# Patient Record
Sex: Female | Born: 1950 | Race: Black or African American | Hispanic: No | Marital: Single
Health system: Southern US, Community
[De-identification: ages and names within clinical notes are randomized; demographics above are authoritative.]

## PROBLEM LIST (undated history)

## (undated) DIAGNOSIS — J45909 Unspecified asthma, uncomplicated: Secondary | ICD-10-CM

---

## 2017-06-18 ENCOUNTER — Emergency Department
Admission: EM | Admit: 2017-06-18 | Discharge: 2017-06-18 | Disposition: A | Discharge status: Home / Self Care | Payer: Self-pay | Attending: Emergency Medicine | Admitting: Emergency Medicine

## 2017-06-18 ENCOUNTER — Emergency Department: Payer: Self-pay

## 2017-06-18 ENCOUNTER — Other Ambulatory Visit: Payer: Self-pay

## 2017-06-18 DIAGNOSIS — Z79899 Other long term (current) drug therapy: Secondary | ICD-10-CM | POA: Insufficient documentation

## 2017-06-18 DIAGNOSIS — J45909 Unspecified asthma, uncomplicated: Secondary | ICD-10-CM | POA: Insufficient documentation

## 2017-06-18 DIAGNOSIS — J069 Acute upper respiratory infection, unspecified: Secondary | ICD-10-CM | POA: Insufficient documentation

## 2017-06-18 DIAGNOSIS — Z7982 Long term (current) use of aspirin: Secondary | ICD-10-CM | POA: Insufficient documentation

## 2017-06-18 DIAGNOSIS — E039 Hypothyroidism, unspecified: Secondary | ICD-10-CM | POA: Insufficient documentation

## 2017-06-18 DIAGNOSIS — I10 Essential (primary) hypertension: Secondary | ICD-10-CM | POA: Insufficient documentation

## 2017-06-18 HISTORY — DX: Unspecified asthma, uncomplicated: J45.909

## 2017-06-18 MED ORDER — DIPHENHYDRAMINE HCL 50 MG/ML IJ SOLN
INTRAMUSCULAR | Status: AC
  Filled 2017-06-18: qty 1

## 2017-06-18 MED ORDER — CETIRIZINE HCL 10 MG PO CAPS: 10.0000 mg | ORAL_CAPSULE | Freq: Every day | ORAL | 3 refills | Status: DC

## 2017-06-18 MED ORDER — PSEUDOEPH-BROMPHEN-DM 30-2-10 MG/5ML PO SYRP: 5.0000 mL | ORAL_SOLUTION | Freq: Four times a day (QID) | ORAL | 0 refills | Status: DC | PRN

## 2017-06-18 NOTE — ED Triage Notes (Signed)
C/O cough.  Onset of symptoms last evening.

## 2017-06-18 NOTE — Discharge Instructions (Signed)
Please follow-up with the primary care provider for symptoms that are not improving over the next few days.  Return to the emergency department for symptoms that change or worsen if you are unable to schedule an appointment.

## 2017-06-18 NOTE — ED Notes (Signed)
See triage note  Son states she developed prod cough last pm  No fever

## 2017-06-18 NOTE — ED Provider Notes (Signed)
[facility] Emergency Department Provider Note  ____________________________________________  Time seen: Approximately 8:14 AM  I have reviewed the triage vital signs and the nursing notes.   HISTORY  Chief Complaint Cough   HPI Tiffany Keith is a 67 y.o. female who presents to the emergency department for treatment and evaluation of cough and sore throat. Symptoms started yesterday. No alleviating measures have been attempted for this complaint. No known fever.    Past Medical History:  Diagnosis Date  . Asthma   . Hypertension   . Hypothyroidism     There are no active problems to display for this patient.   Past Surgical History:  Procedure Laterality Date  . COLONOSCOPY WITH PROPOFOL N/A 11/26/2014   Procedure: COLONOSCOPY WITH PROPOFOL;  Surgeon: Elnita MaxwellMatthew Gordon Rein, MD;  [facility];  Service: Endoscopy;  Laterality: N/A;    Prior to Admission medications   Medication Sig Start Date End Date Taking? Authorizing Provider  albuterol (PROVENTIL HFA;VENTOLIN HFA) 108 (90 Base) MCG/ACT inhaler Inhale 2-4 puffs by mouth every 4 hours as needed for wheezing, cough, and/or shortness of breath 10/27/16   Loleta RoseForbach, Cory, MD  aspirin 81 MG tablet Take 81 mg by mouth daily.    [provider]  atorvastatin (LIPITOR) 20 MG tablet Take 20 mg by mouth daily.    [provider]  brompheniramine-pseudoephedrine-DM 30-2-10 MG/5ML syrup Take 5 mLs by mouth 4 (four) times daily as needed. 06/18/17   Nixon Kolton B, FNP  cephALEXin (KEFLEX) 500 MG capsule Take 1 capsule (500 mg total) by mouth 2 (two) times daily. 10/26/16   Jene EveryKinner, Robert, MD  Cetirizine HCl 10 MG CAPS Take 1 capsule (10 mg total) by mouth daily. 06/18/17   Deanthony Maull, Rulon Eisenmengerari B, FNP  levothyroxine (SYNTHROID, LEVOTHROID) 125 MCG tablet Take 125 mcg by mouth daily before breakfast.    [provider]    Allergies Patient has no known allergies.  No family  history on file.  Social History Social History   Tobacco Use  . Smoking status: Never Smoker  . Smokeless tobacco: Never Used  Substance Use Topics  . Alcohol use: No  . Drug use: No    Review of Systems Constitutional: Negative for fever/chills ENT: Positive for sore throat. Cardiovascular: Denies chest pain. Respiratory: Negative for shortness of breath.  Positive for cough. Gastrointestinal: Negative for nausea, no vomiting.  No diarrhea.  Musculoskeletal: Negative for body aches Skin: Negative for rash. Neurological: Negative for headaches ____________________________________________   PHYSICAL EXAM:  VITAL SIGNS: ED Triage Vitals  Enc Vitals Group     BP 06/18/17 0742 140/85     Pulse Rate 06/18/17 0742 72     Resp 06/18/17 0742 16     Temp 06/18/17 0742 98.8 F (37.1 C)     Temp Source 06/18/17 0742 Oral     SpO2 06/18/17 0742 100 %     Weight 06/18/17 0742 168 lb 6.9 oz (76.4 kg)     Height --      Head Circumference --      Peak Flow --      Pain Score 06/18/17 0736 0     Pain Loc --      Pain Edu? --      Excl. in GC? --     Constitutional: Alert and oriented.  Well appearing and in no acute distress. Eyes: Conjunctivae are normal. EOMI. Ears: Bilateral tympanic membranes are mildly injected but otherwise normal Nose: No sinus congestion  noted; no rhinnorhea. Mouth/Throat: Mucous membranes are moist.  Oropharynx mildly erythematous. Tonsils not visualized Neck: No stridor.  Lymphatic: No anterior cervical lymphadenopathy. Cardiovascular: Normal rate, regular rhythm. Good peripheral circulation. Respiratory: Normal respiratory effort.  No retractions.  Breath sounds clear to auscultation throughout.  Gastrointestinal: Soft and nontender.  Musculoskeletal: FROM x 4 extremities.  Neurologic:  Normal speech and language.  Skin:  Skin is warm, dry and intact. No rash noted. Psychiatric: Mood and affect are normal. Speech and behavior are  normal.  ____________________________________________   LABS (all labs ordered are listed, but only abnormal results are displayed)  Labs Reviewed - No data to display _________________________________________ ____________________________________________  RADIOLOGY  Chest x-ray negative for acute cardiopulmonary abnormality per radiology. ____________________________________________   PROCEDURES  Procedure(s) performed: None  Critical Care performed: No ____________________________________________   INITIAL IMPRESSION / ASSESSMENT AND PLAN / ED COURSE  67 y.o. female who presents to the emergency department with her legal guardian for treatment and evaluation of cough and sore throat.  Symptoms started less than 24 hours ago.  Chest x-ray is negative.  Patient complains that the cough is keeping her awake at night and she is unable to lie flat without coughing.  She will be given a prescription for cetirizine and Bromfed.  Liquid guardian was encouraged to have her follow-up with the primary care provider for symptoms that do not improve over the next few days.  He was encouraged to return with her to the emergency department for symptoms of change or worsen if she is unable to schedule appointment.  Medications - No data to display   ED Discharge Orders        Ordered    Cetirizine HCl 10 MG CAPS  Daily     06/18/17 0842    brompheniramine-pseudoephedrine-DM 30-2-10 MG/5ML syrup  4 times daily PRN     06/18/17 0842       Pertinent labs & imaging results that were available during my care of the patient were reviewed by me and considered in my medical decision making (see chart for details).    If controlled substance prescribed during this visit, 12 month history viewed on the [facility] prior to issuing an initial prescription for Schedule II or III opiod. ____________________________________________   FINAL CLINICAL IMPRESSION(S) / ED DIAGNOSES  Final diagnoses:   Acute upper respiratory infection    Note:  This document was prepared using Dragon voice recognition software and may include unintentional dictation errors.     Chinita Pester, FNP 06/18/17 1610    Jene Every, MD 06/18/17 [phone]

## 2017-10-09 ENCOUNTER — Other Ambulatory Visit: Payer: Self-pay | Admitting: Family Medicine

## 2017-10-09 DIAGNOSIS — Z1231 Encounter for screening mammogram for malignant neoplasm of breast: Secondary | ICD-10-CM

## 2017-10-12 ENCOUNTER — Other Ambulatory Visit: Payer: Self-pay | Admitting: Family Medicine

## 2017-10-12 DIAGNOSIS — Q740 Other congenital malformations of upper limb(s), including shoulder girdle: Secondary | ICD-10-CM

## 2017-10-22 ENCOUNTER — Other Ambulatory Visit: Payer: Self-pay | Admitting: Family Medicine

## 2017-10-22 DIAGNOSIS — R609 Edema, unspecified: Secondary | ICD-10-CM

## 2017-10-22 DIAGNOSIS — Q740 Other congenital malformations of upper limb(s), including shoulder girdle: Secondary | ICD-10-CM

## 2017-10-23 ENCOUNTER — Ambulatory Visit
Admission: RE | Admit: 2017-10-23 | Discharge: 2017-10-23 | Disposition: A | Discharge status: Home / Self Care | Payer: Medicare Other | Source: Ambulatory Visit | Attending: Family Medicine | Admitting: Family Medicine

## 2017-10-23 DIAGNOSIS — Q740 Other congenital malformations of upper limb(s), including shoulder girdle: Secondary | ICD-10-CM | POA: Diagnosis not present

## 2017-10-23 DIAGNOSIS — R609 Edema, unspecified: Secondary | ICD-10-CM

## 2019-02-01 IMAGING — US US EXTREM  UP VENOUS*L*
1 series · 13 of 24 positions shown · non-contrast
Comparison: None.

CLINICAL DATA: Left upper arm swelling for 3 days.



[Series 1: us extrem up venous*left* · 0.08mm/px · 13 of 40 slices shown]
[im 1/40]
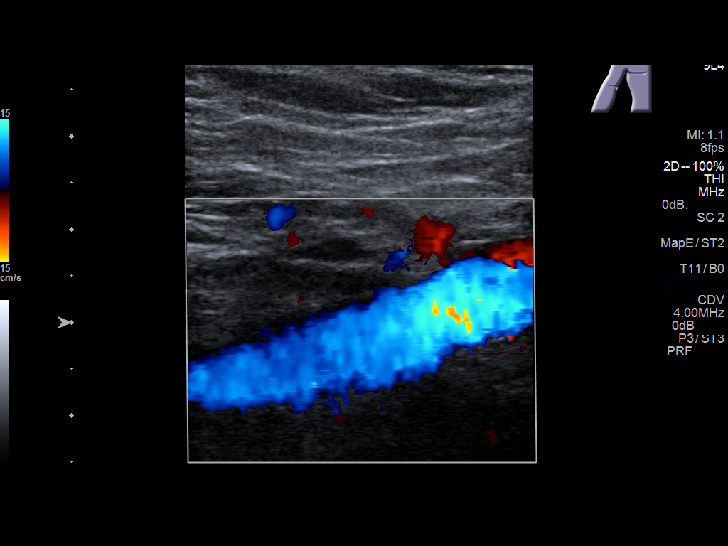
[im 4/40]
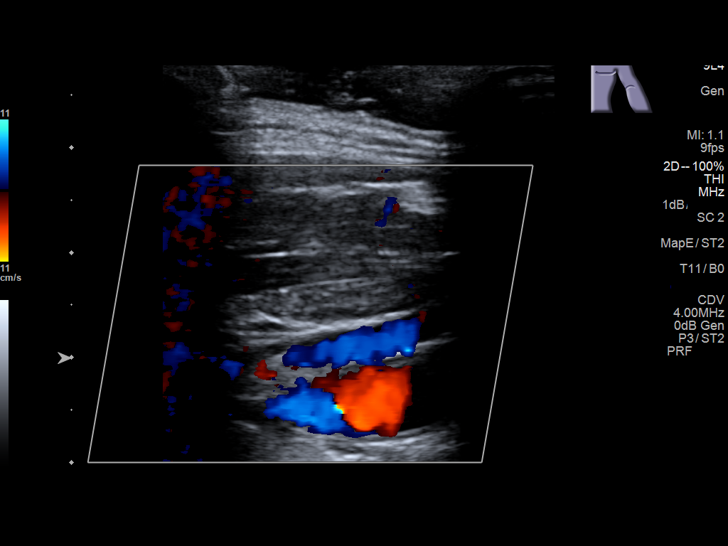
[im 7/40]
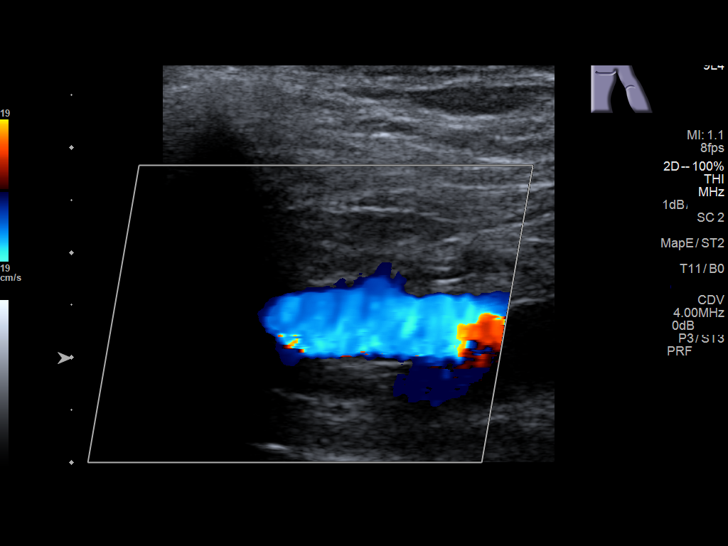
[im 11/40]
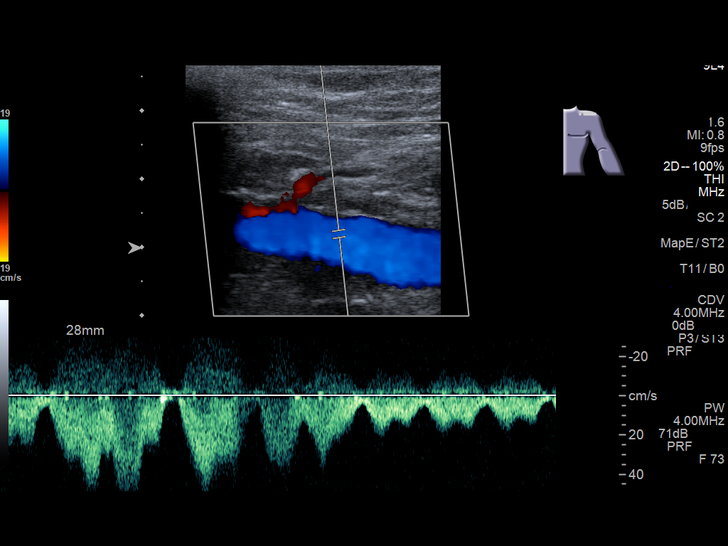
[im 14/40]
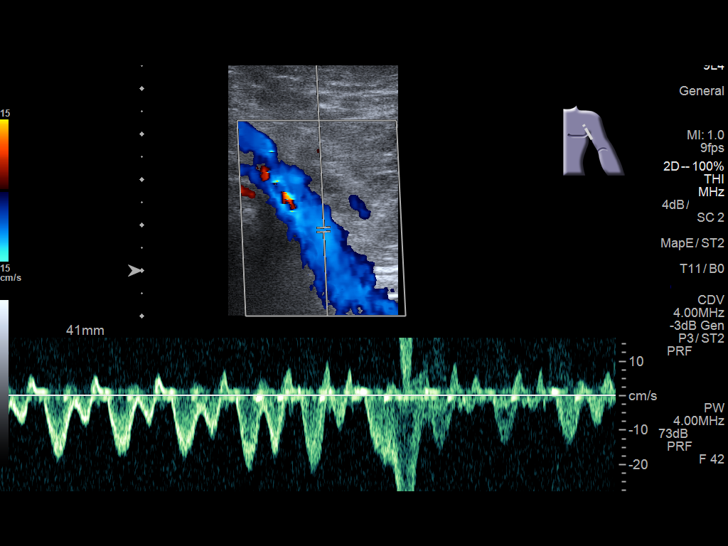
[im 17/40]
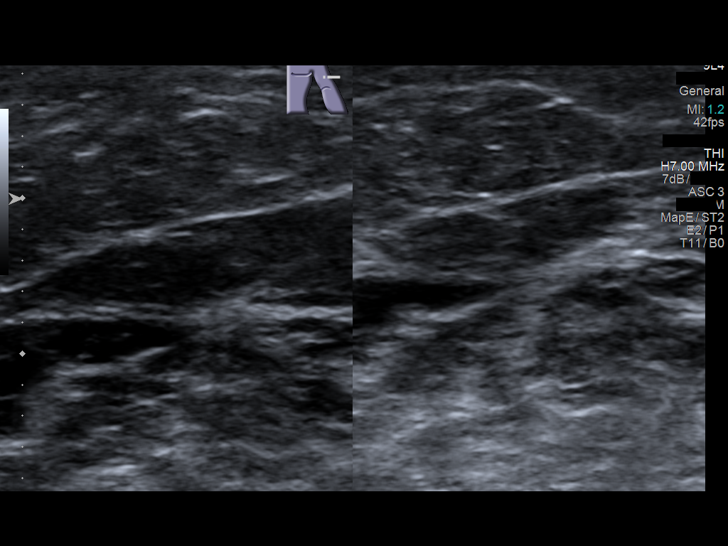
[im 21/40]
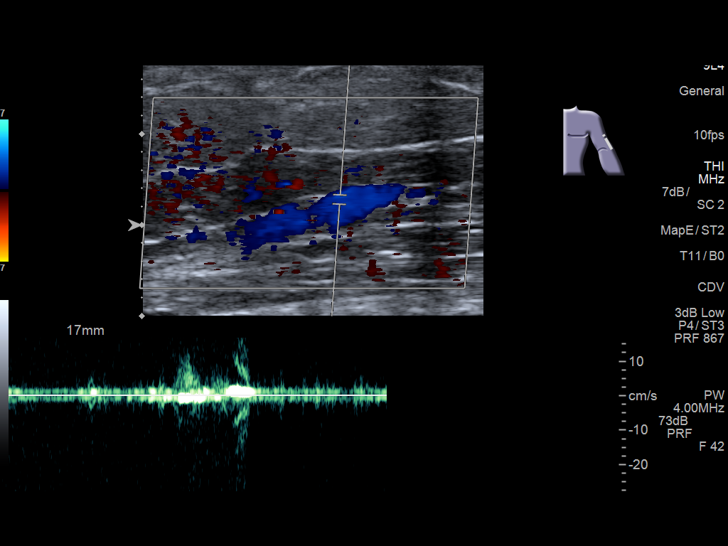
[im 23/40]
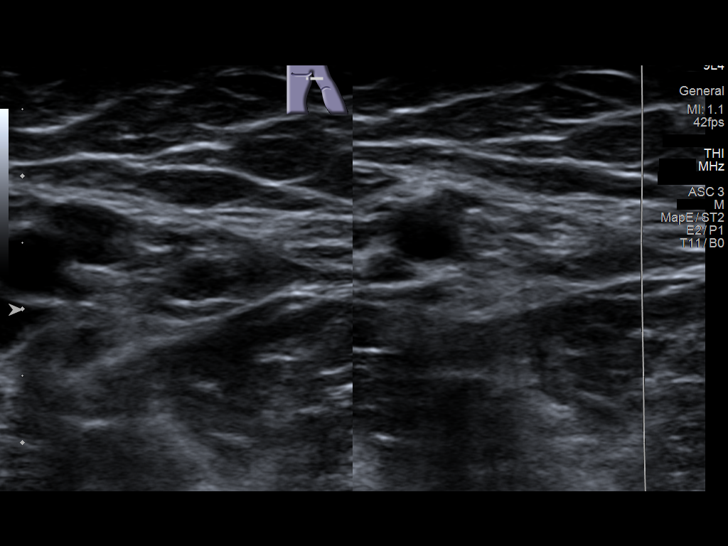
[im 26/40]
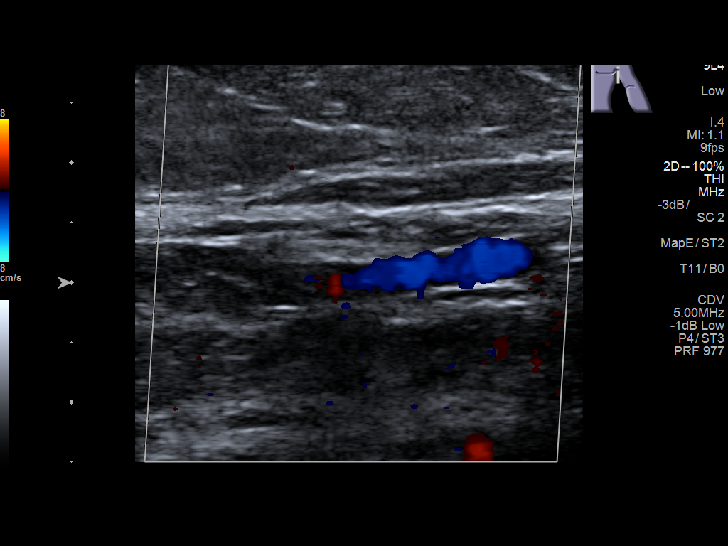
[im 29/40]
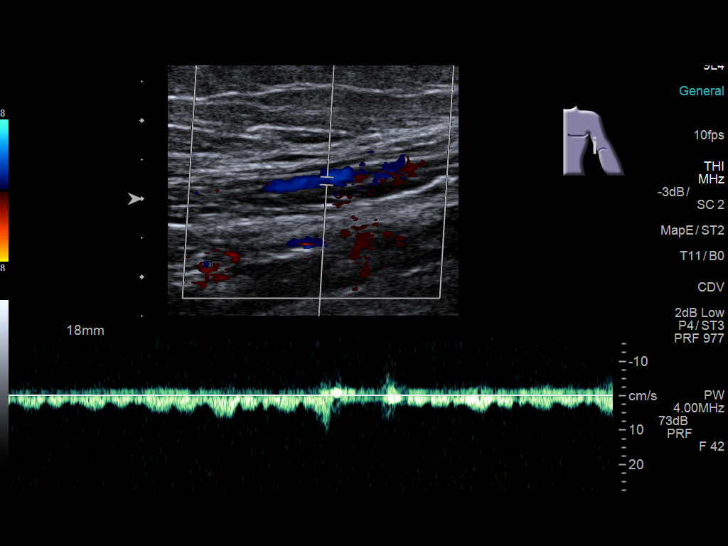
[im 33/40]
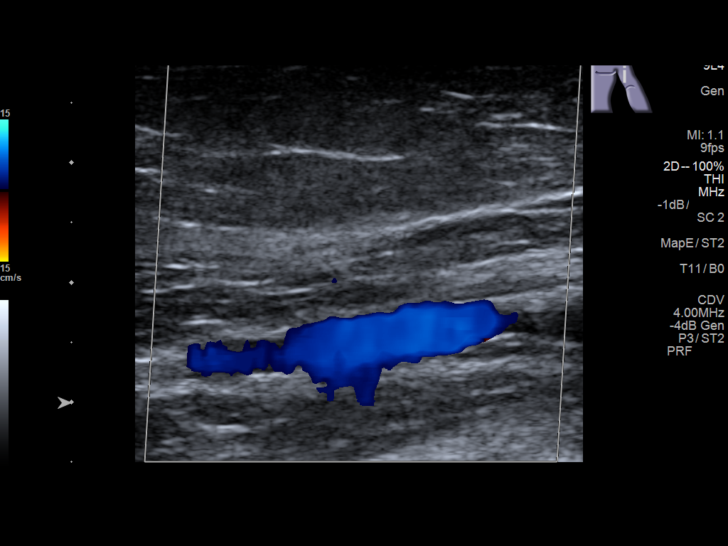
[im 36/40]
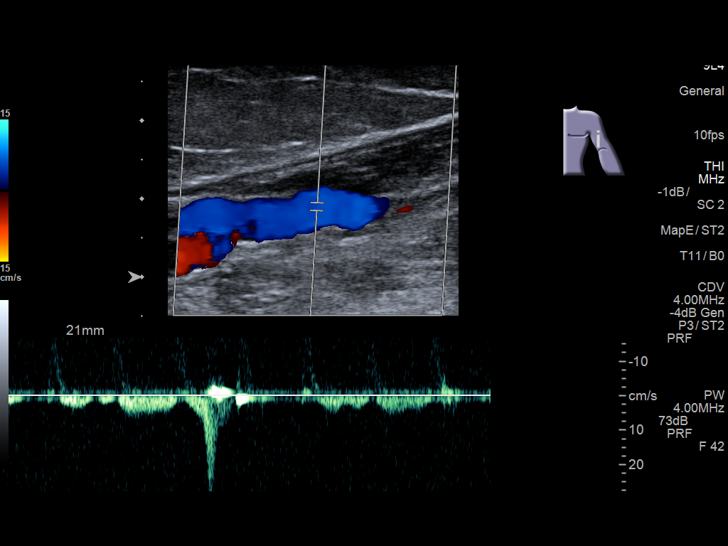
[im 40/40]
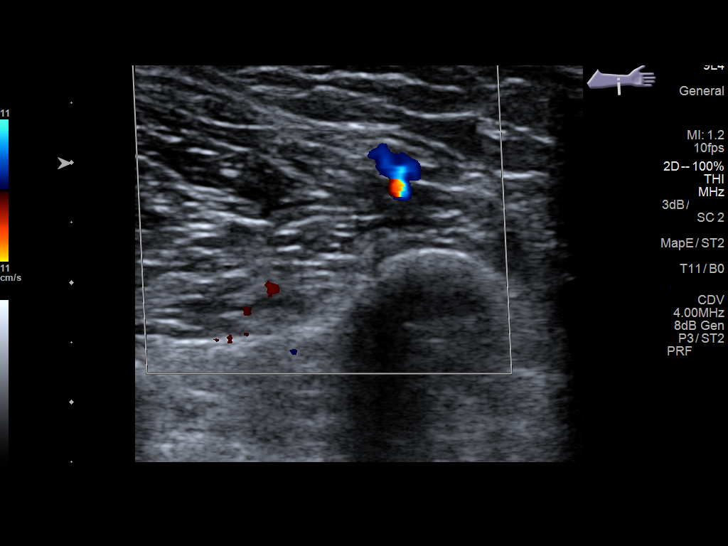

[13 of 24 positions shown; findings below may reference images not displayed]

FINDINGS: Contralateral Subclavian Vein: Right subclavian artery is patent
with color Doppler flow and normal phasicity.

Internal Jugular Vein: No thrombus. Normal compressibility with
color Doppler flow and phasicity.

Subclavian Vein: No thrombus. Normal color Doppler flow with
phasicity. Left subclavian vein is compressible.

Axillary Vein: No evidence of thrombus. Normal compressibility,
respiratory phasicity and response to augmentation.

Cephalic Vein: No evidence of thrombus. Normal compressibility,
respiratory phasicity and response to augmentation.

Basilic Vein: No evidence of thrombus. Normal compressibility,
respiratory phasicity and response to augmentation.

Brachial Veins: No evidence of thrombus. Normal compressibility,
respiratory phasicity and response to augmentation.

Radial Veins: No evidence of thrombus.

Ulnar Veins: No evidence of thrombus.

Venous Reflux:  None visualized.

Other Findings:  None visualized.
IMPRESSION: No evidence of DVT within the left upper extremity.

## 2019-02-01 IMAGING — US US EXTREM UP*L* LTD
1 series · 14 of 15 positions shown · non-contrast
Comparison: None.

CLINICAL DATA: Posterior left upper arm swelling. Possible palpable
mass.

EXAM:
ULTRASOUND LEFT UPPER EXTREMITY LIMITED
TECHNIQUE: Ultrasound examination of the upper extremity soft tissues was
performed in the area of clinical concern.

[Series 1: us extrem up*left* ltd · 0.09mm/px · 14 of 15 slices shown]
[im 1/15]
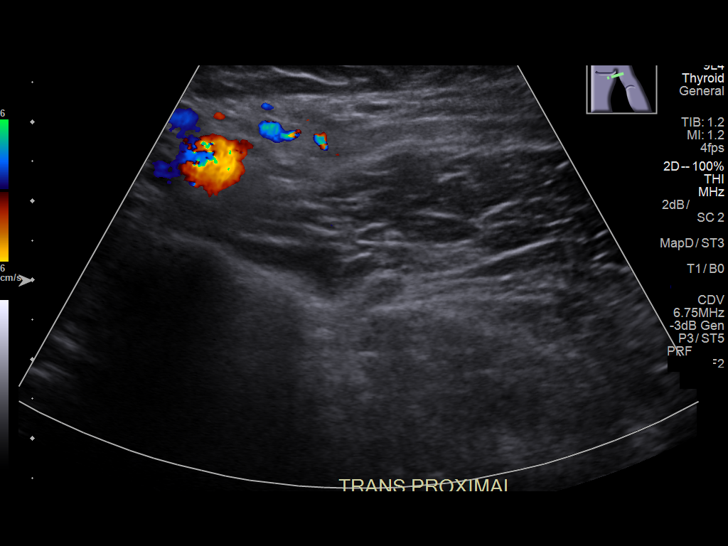
[im 2/15]
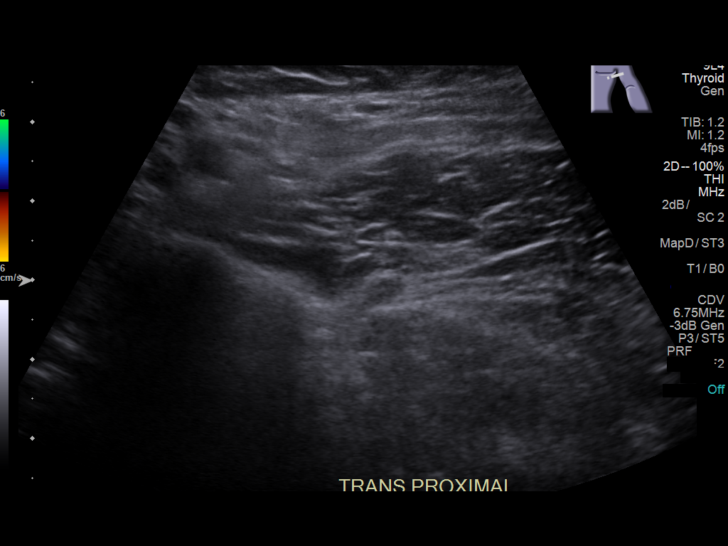
[im 3/15]
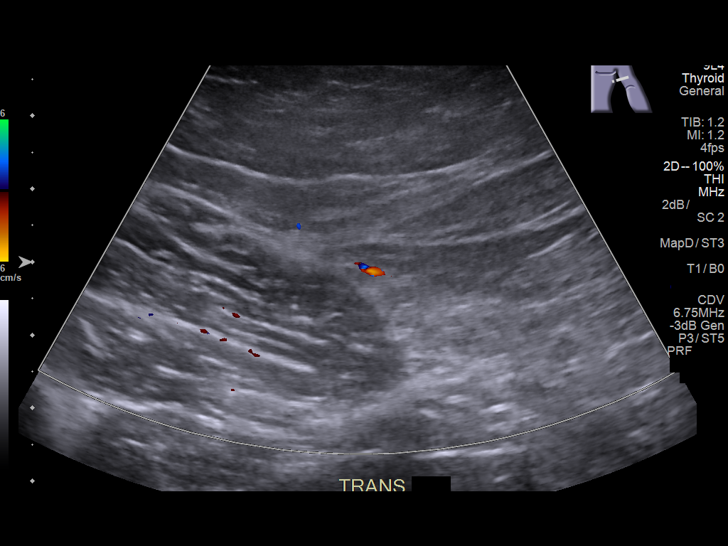
[im 4/15]
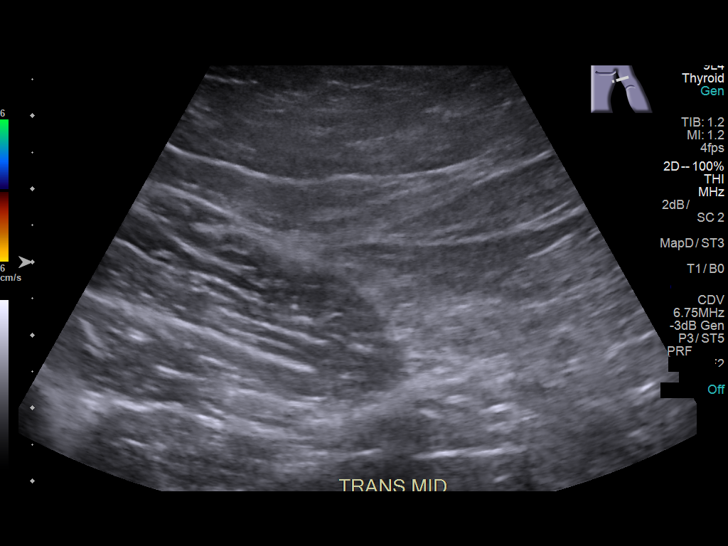
[im 5/15]
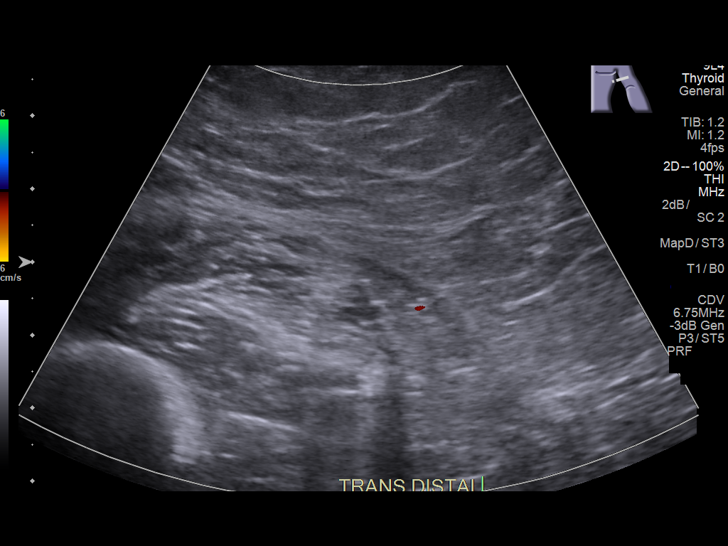
[im 6/15]
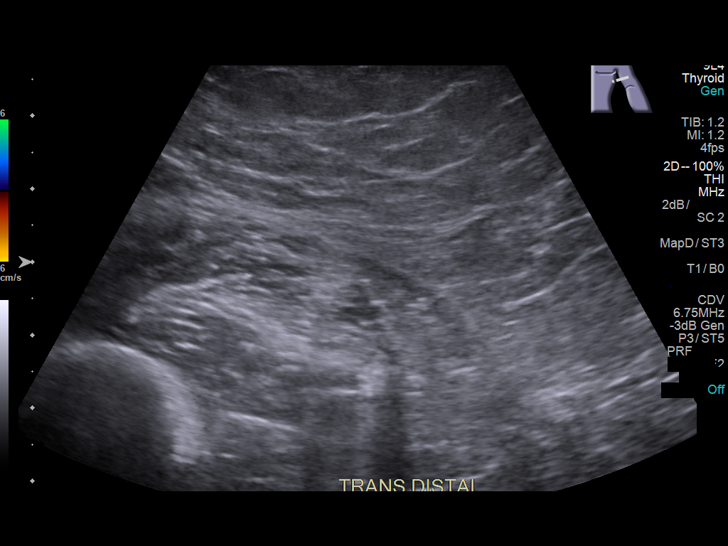
[im 7/15]
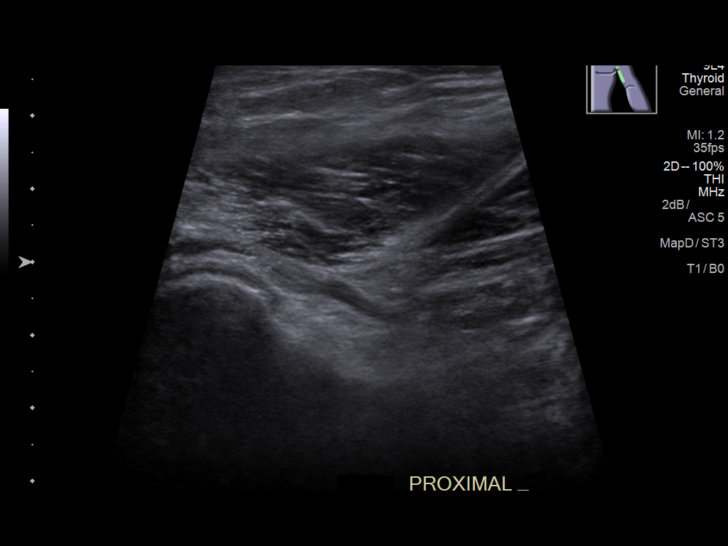
[im 9/15]
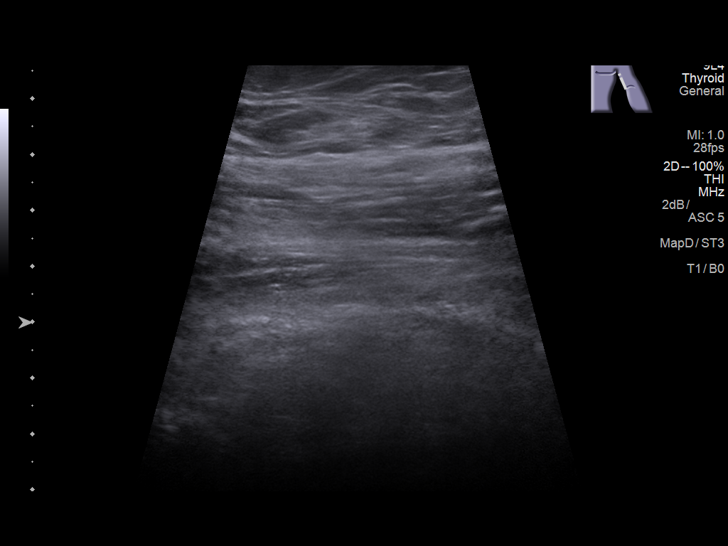
[im 10/15]
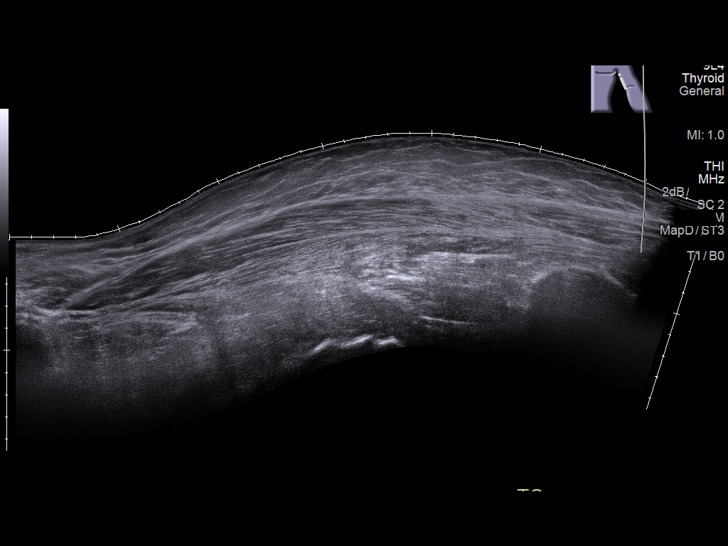
[im 11/15]
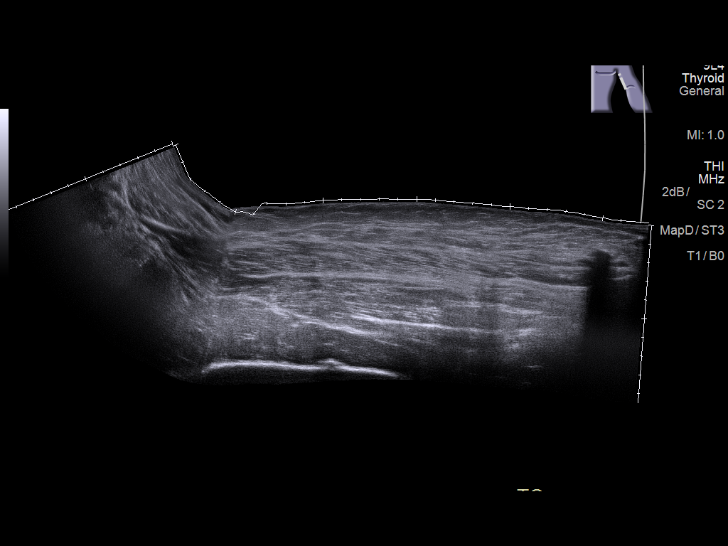
[im 12/15]
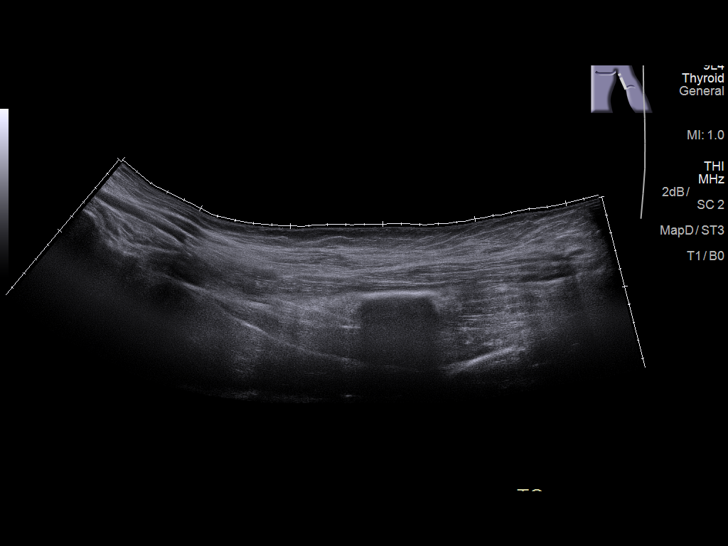
[im 13/15]
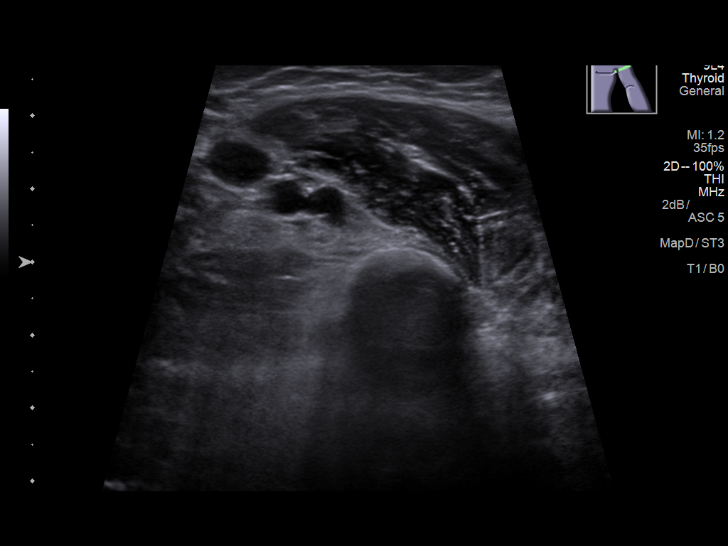
[im 14/15]
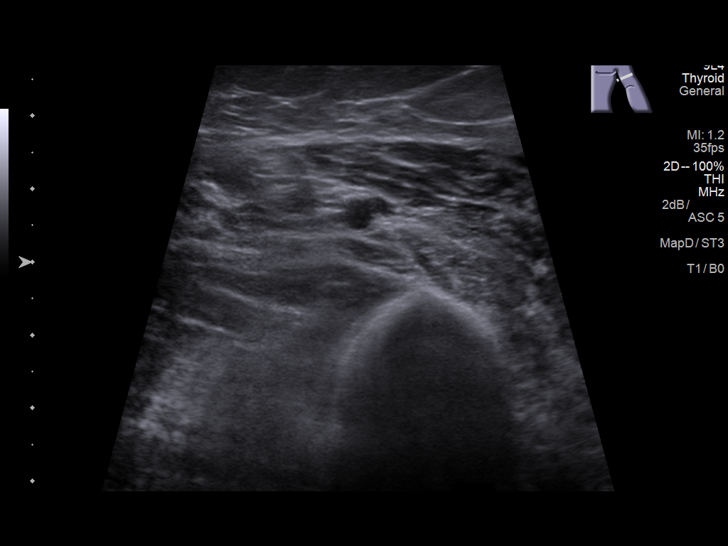
[im 15/15]
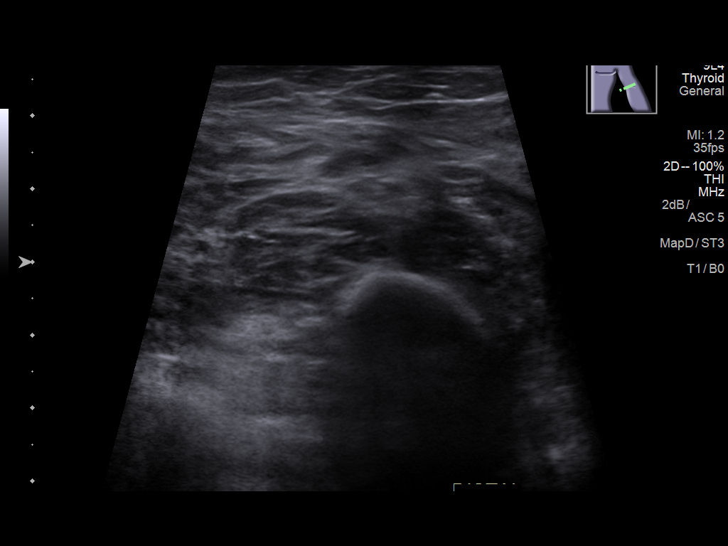

[14 of 15 positions shown; findings below may reference images not displayed]

FINDINGS: Focused ultrasound in the area of clinical concern along the left
posterior and medial upper arm demonstrates no discrete soft tissue
mass or fluid collection.
IMPRESSION: No soft tissue mass or fluid collection in the area of clinical
concern.

## 2021-06-18 DEATH — deceased
